# Patient Record
Sex: Male | Born: 1980 | Race: Black or African American | Hispanic: No | Marital: Single | Smoking: Current every day smoker
Health system: Southern US, Community
[De-identification: ages and names within clinical notes are randomized; demographics above are authoritative.]

## PROBLEM LIST (undated history)

## (undated) DIAGNOSIS — I079 Rheumatic tricuspid valve disease, unspecified: Secondary | ICD-10-CM

---

## 2012-05-03 ENCOUNTER — Emergency Department: Payer: Self-pay | Admitting: Emergency Medicine

## 2012-05-03 LAB — COMPREHENSIVE METABOLIC PANEL
Albumin: 3.3 g/dL — ABNORMAL LOW (ref 3.4–5.0)
Anion Gap: 4 — ABNORMAL LOW (ref 7–16)
Calcium, Total: 9.6 mg/dL (ref 8.5–10.1)
Chloride: 107 mmol/L (ref 98–107)
Co2: 26 mmol/L (ref 21–32)
EGFR (African American): >60
Glucose: 111 mg/dL — ABNORMAL HIGH (ref 65–99)
Osmolality: 274 (ref 275–301)
Potassium: 4.5 mmol/L (ref 3.5–5.1)
SGOT(AST): 516 U/L — ABNORMAL HIGH (ref 15–37)
SGPT (ALT): 355 U/L — ABNORMAL HIGH (ref 12–78)
Total Protein: 8 g/dL (ref 6.4–8.2)

## 2012-05-03 LAB — CBC WITH DIFFERENTIAL/PLATELET
Basophil #: 0 10*3/uL (ref 0.0–0.1)
Basophil %: 0.2 %
Eosinophil %: 0.6 %
HGB: 14.7 g/dL (ref 13.0–18.0)
Lymphocyte %: 3.2 %
Monocyte #: 0.8 x10 3/mm (ref 0.2–1.0)
Monocyte %: 7.2 %
Neutrophil #: 9.9 10*3/uL — ABNORMAL HIGH (ref 1.4–6.5)
Neutrophil %: 88.8 %
RBC: 5.44 10*6/uL (ref 4.40–5.90)
WBC: 11.2 10*3/uL — ABNORMAL HIGH (ref 3.8–10.6)

## 2012-05-03 LAB — LIPASE, BLOOD: Lipase: 135 U/L (ref 73–393)

## 2017-08-31 DIAGNOSIS — S069XAA Unspecified intracranial injury with loss of consciousness status unknown, initial encounter: Secondary | ICD-10-CM

## 2017-08-31 HISTORY — DX: Unspecified intracranial injury with loss of consciousness status unknown, initial encounter: S06.9XAA

## 2021-02-14 ENCOUNTER — Emergency Department (HOSPITAL_BASED_OUTPATIENT_CLINIC_OR_DEPARTMENT_OTHER): Payer: Self-pay

## 2021-02-14 ENCOUNTER — Encounter (HOSPITAL_BASED_OUTPATIENT_CLINIC_OR_DEPARTMENT_OTHER): Payer: Self-pay

## 2021-02-14 ENCOUNTER — Emergency Department (HOSPITAL_BASED_OUTPATIENT_CLINIC_OR_DEPARTMENT_OTHER): Payer: Self-pay | Admitting: Radiology

## 2021-02-14 ENCOUNTER — Emergency Department (HOSPITAL_BASED_OUTPATIENT_CLINIC_OR_DEPARTMENT_OTHER)
Admission: EM | Admit: 2021-02-14 | Discharge: 2021-02-14 | Disposition: A | Discharge status: Home / Self Care | Payer: Self-pay | Attending: Emergency Medicine | Admitting: Emergency Medicine

## 2021-02-14 ENCOUNTER — Other Ambulatory Visit: Payer: Self-pay

## 2021-02-14 DIAGNOSIS — R4020 Unspecified coma: Secondary | ICD-10-CM

## 2021-02-14 DIAGNOSIS — R202 Paresthesia of skin: Secondary | ICD-10-CM | POA: Insufficient documentation

## 2021-02-14 DIAGNOSIS — R55 Syncope and collapse: Secondary | ICD-10-CM | POA: Insufficient documentation

## 2021-02-14 DIAGNOSIS — R5381 Other malaise: Secondary | ICD-10-CM | POA: Insufficient documentation

## 2021-02-14 DIAGNOSIS — R5383 Other fatigue: Secondary | ICD-10-CM | POA: Insufficient documentation

## 2021-02-14 DIAGNOSIS — F17210 Nicotine dependence, cigarettes, uncomplicated: Secondary | ICD-10-CM | POA: Insufficient documentation

## 2021-02-14 DIAGNOSIS — R0781 Pleurodynia: Secondary | ICD-10-CM | POA: Insufficient documentation

## 2021-02-14 DIAGNOSIS — R110 Nausea: Secondary | ICD-10-CM | POA: Insufficient documentation

## 2021-02-14 LAB — COMPREHENSIVE METABOLIC PANEL
ALT: 19 U/L (ref 0–44)
AST: 21 U/L (ref 15–41)
Albumin: 4.1 g/dL (ref 3.5–5.0)
Alkaline Phosphatase: 52 U/L (ref 38–126)
Anion gap: 7 (ref 5–15)
BUN: 11 mg/dL (ref 6–20)
CO2: 28 mmol/L (ref 22–32)
Calcium: 10.2 mg/dL (ref 8.9–10.3)
Chloride: 106 mmol/L (ref 98–111)
Creatinine, Ser: 1.16 mg/dL (ref 0.61–1.24)
GFR, Estimated: >60 mL/min (ref >60)
Glucose, Bld: 86 mg/dL (ref 70–99)
Potassium: 4.1 mmol/L (ref 3.5–5.1)
Sodium: 141 mmol/L (ref 135–145)
Total Bilirubin: 0.6 mg/dL (ref 0.3–1.2)
Total Protein: 7.9 g/dL (ref 6.5–8.1)

## 2021-02-14 LAB — RAPID URINE DRUG SCREEN, HOSP PERFORMED
Amphetamines: NOT DETECTED
Barbiturates: NOT DETECTED
Benzodiazepines: NOT DETECTED
Cocaine: NOT DETECTED
Opiates: NOT DETECTED
Tetrahydrocannabinol: POSITIVE — AB

## 2021-02-14 LAB — CBC WITH DIFFERENTIAL/PLATELET
Abs Immature Granulocytes: 0.02 10*3/uL (ref 0.00–0.07)
Basophils Absolute: 0 10*3/uL (ref 0.0–0.1)
Basophils Relative: 1 %
Eosinophils Absolute: 0.1 10*3/uL (ref 0.0–0.5)
Eosinophils Relative: 3 %
HCT: 46.8 % (ref 39.0–52.0)
Hemoglobin: 15.6 g/dL (ref 13.0–17.0)
Immature Granulocytes: 0 %
Lymphocytes Relative: 31 %
Lymphs Abs: 1.7 10*3/uL (ref 0.7–4.0)
MCH: 28 pg (ref 26.0–34.0)
MCHC: 33.3 g/dL (ref 30.0–36.0)
MCV: 83.9 fL (ref 80.0–100.0)
Monocytes Absolute: 0.7 10*3/uL (ref 0.1–1.0)
Monocytes Relative: 13 %
Neutro Abs: 2.9 10*3/uL (ref 1.7–7.7)
Neutrophils Relative %: 52 %
Platelets: 183 10*3/uL (ref 150–400)
RBC: 5.58 MIL/uL (ref 4.22–5.81)
RDW: 15.3 % (ref 11.5–15.5)
WBC: 5.5 10*3/uL (ref 4.0–10.5)
nRBC: 0 % (ref 0.0–0.2)

## 2021-02-14 LAB — LIPASE, BLOOD: Lipase: 41 U/L (ref 11–51)

## 2021-02-14 MED ORDER — IOHEXOL 350 MG/ML SOLN
80.0000 mL | Freq: Once | INTRAVENOUS | Status: AC | PRN
  Administered 2021-02-14: 80 mL via INTRAVENOUS

## 2021-02-14 MED ORDER — DIPHENHYDRAMINE HCL 50 MG/ML IJ SOLN
25.0000 mg | Freq: Once | INTRAMUSCULAR | Status: AC
  Administered 2021-02-14: 25 mg via INTRAVENOUS
  Filled 2021-02-14: qty 1

## 2021-02-14 MED ORDER — LORAZEPAM 2 MG/ML IJ SOLN
1.0000 mg | Freq: Once | INTRAMUSCULAR | Status: AC
  Administered 2021-02-14: 1 mg via INTRAVENOUS
  Filled 2021-02-14: qty 1

## 2021-02-14 MED ORDER — PROCHLORPERAZINE EDISYLATE 10 MG/2ML IJ SOLN
10.0000 mg | Freq: Once | INTRAMUSCULAR | Status: AC
  Administered 2021-02-14: 10 mg via INTRAVENOUS
  Filled 2021-02-14: qty 2

## 2021-02-14 MED ORDER — SODIUM CHLORIDE 0.9 % IV BOLUS
1000.0000 mL | Freq: Once | INTRAVENOUS | Status: AC
  Administered 2021-02-14: 1000 mL via INTRAVENOUS

## 2021-02-14 NOTE — Discharge Instructions (Signed)
Recommend that you do not drive or do any dangerous activities until you are cleared by neurology and have seizure ruled out as possible cause of your symptoms.  Suspect that this is likely from complex migraines and recommend evaluation by neurology.
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
## 2021-02-14 NOTE — ED Notes (Signed)
Patient having apparent panic attack, restless, crying, rapid shallow breathing.  MD notified, new order for Ativan given.

## 2022-02-01 IMAGING — CT CT CERVICAL SPINE W/O CM
3 series · 14 of 33 positions shown, 17 images · non-contrast
Comparison: None.

CLINICAL DATA: Syncopal episode resulting in a fall.

EXAM:
CT HEAD WITHOUT CONTRAST
CT CERVICAL SPINE WITHOUT CONTRAST
TECHNIQUE: Multidetector CT imaging of the head and cervical spine was
performed following the standard protocol without intravenous
contrast. Multiplanar CT image reconstructions of the cervical spine
were also generated.

[Series 4: c spine soft · axial · 0.32mm/px · z∈[-400,-264]mm · 6 of 89 slices shown, 8 images]
[im 14/89  soft-tissue]
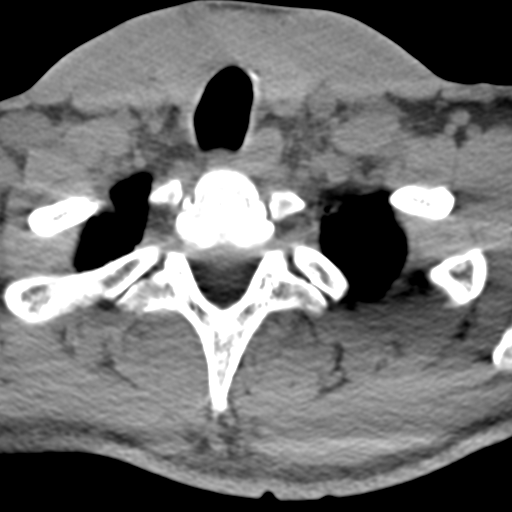
[im 14/89  bone]
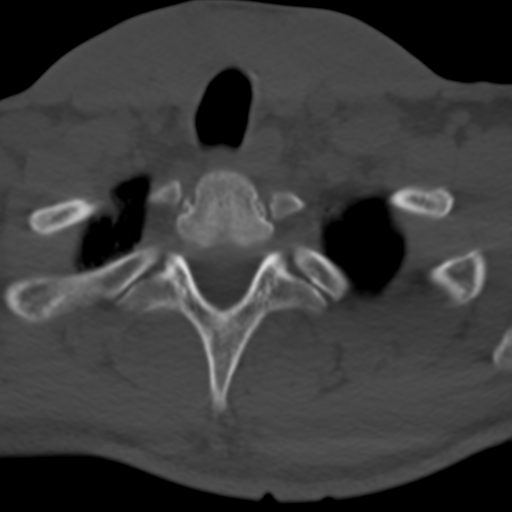
[im 28/89  bone]
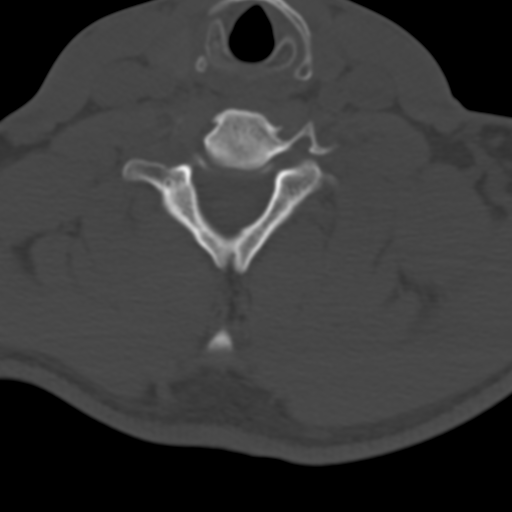
[im 41/89  bone]
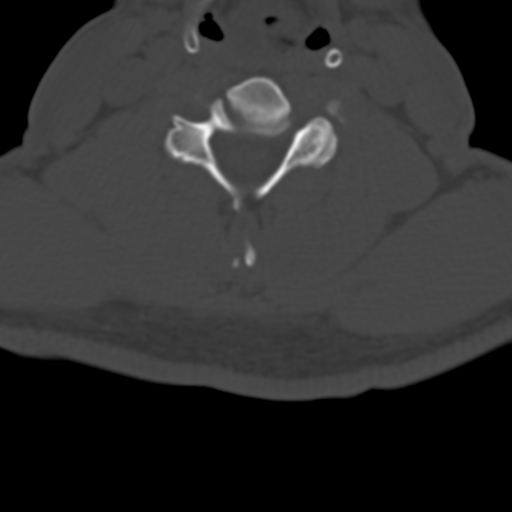
[im 55/89  bone]
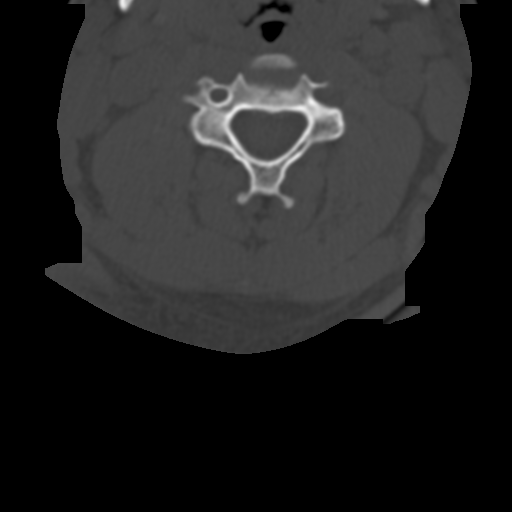
[im 68/89  soft-tissue]
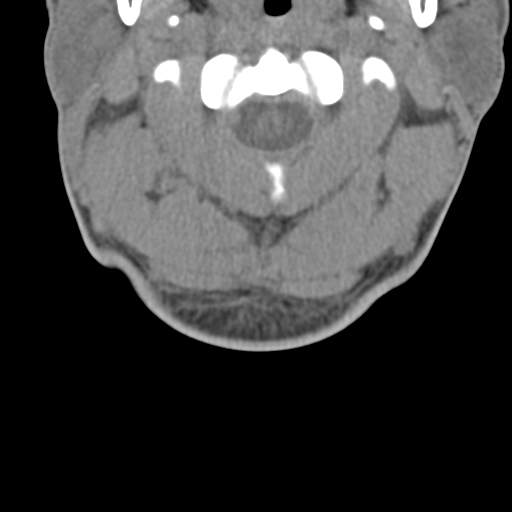
[im 68/89  bone]
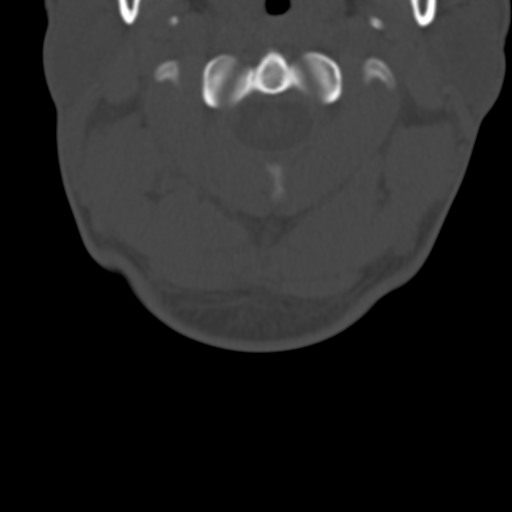
[im 82/89  bone]
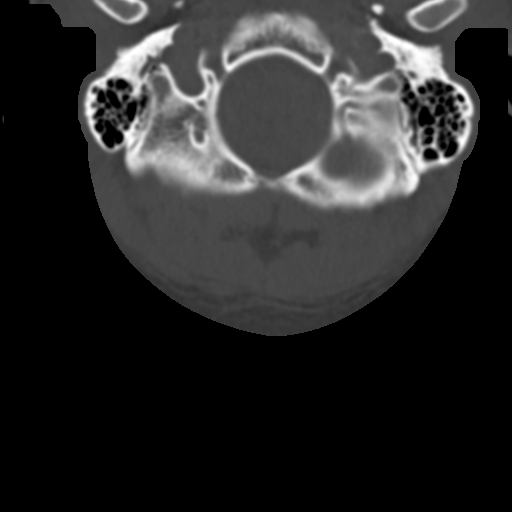

[Series 5: cor bone · coronal · 0.35mm/px · 3 of 52 slices shown]
[im 11/52  bone]
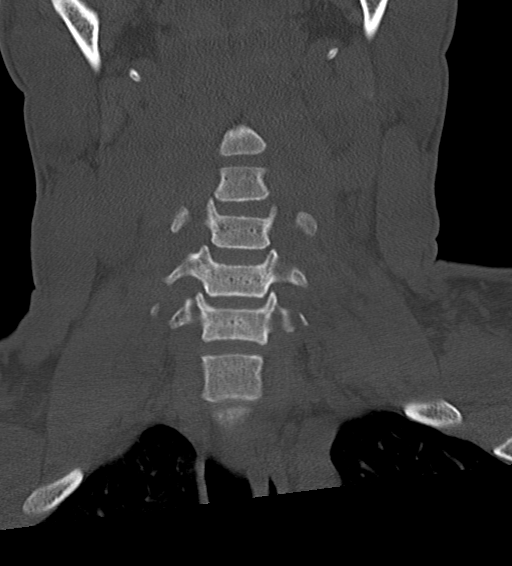
[im 21/52  bone]
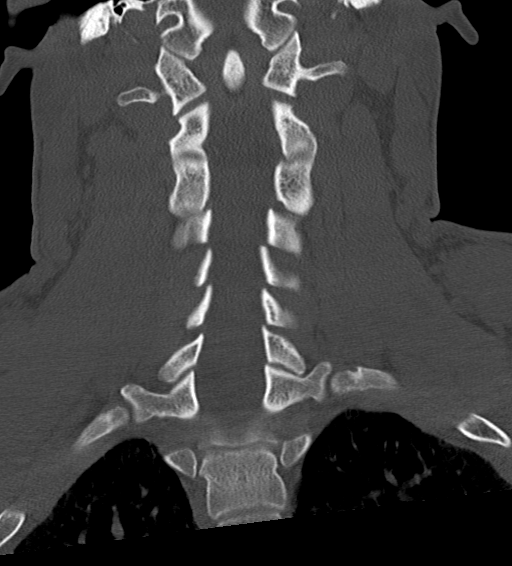
[im 31/52  bone]
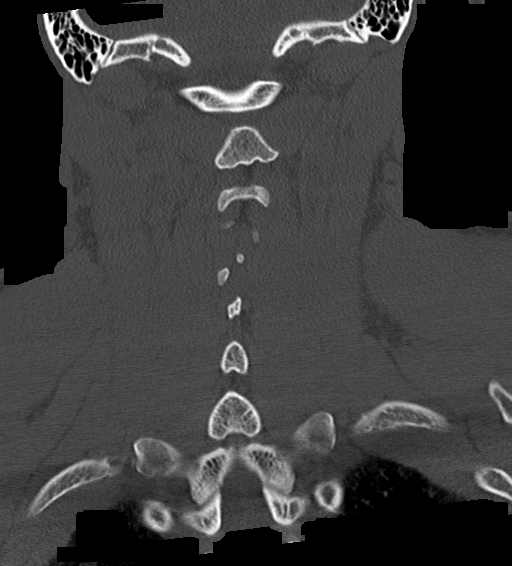

[Series 6: sag bone · sagittal · 0.29mm/px · 5 of 52 slices shown, 6 images]
[im 18/52  bone]
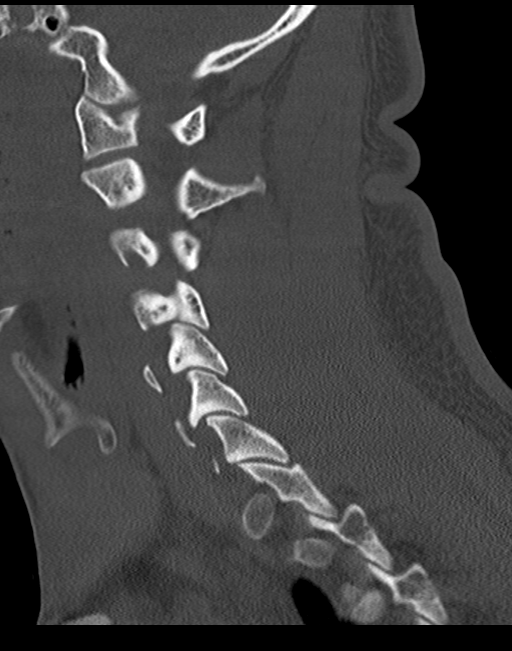
[im 22/52  bone]
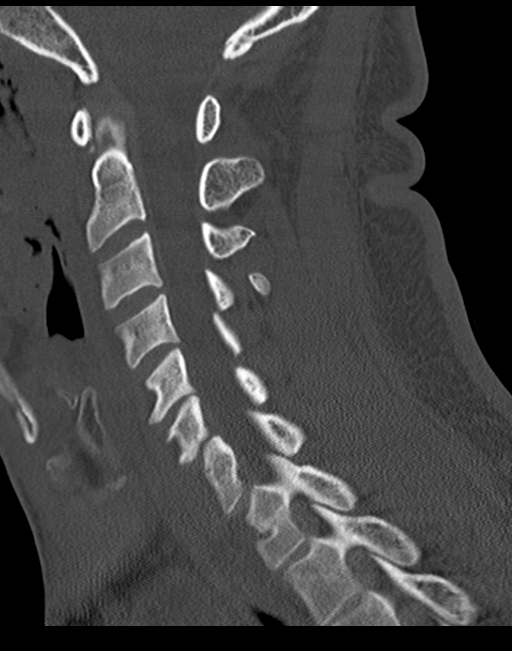
[im 26/52  soft-tissue]
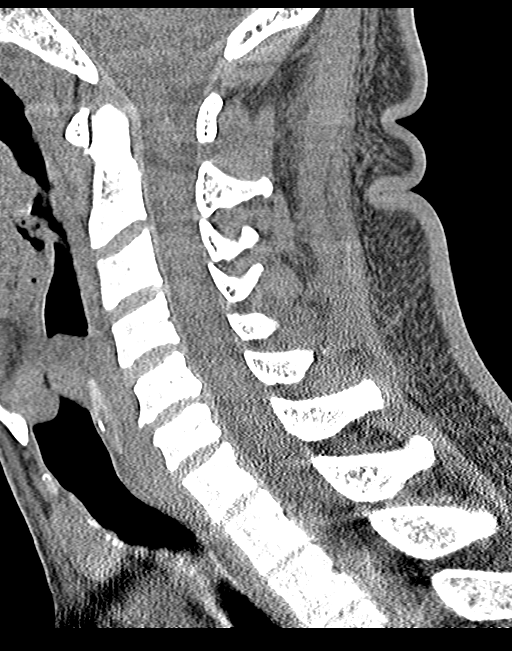
[im 26/52  bone]
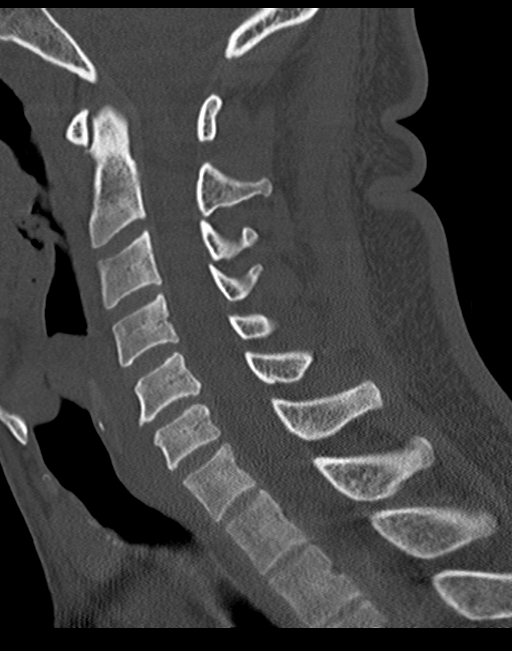
[im 30/52  bone]
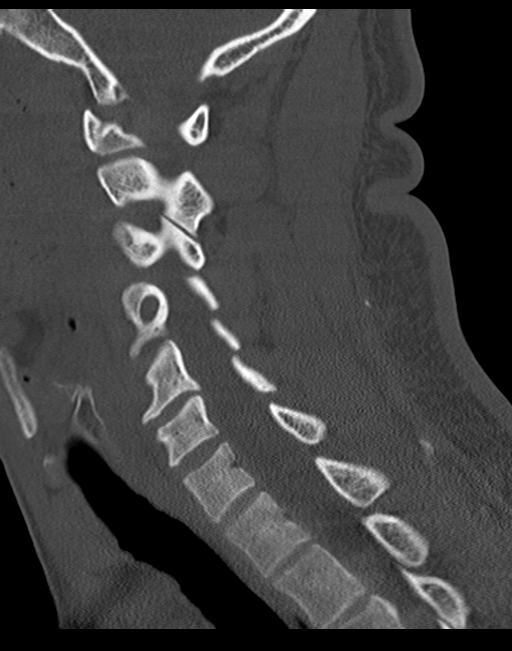
[im 35/52  bone]
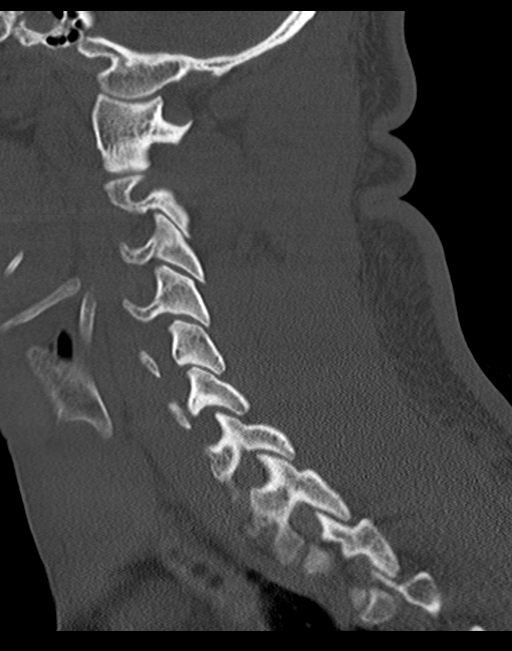

[14 of 33 positions shown; findings below may reference images not displayed]

FINDINGS: CT HEAD FINDINGS

Brain: There is no evidence of an acute infarct, intracranial
hemorrhage, mass, midline shift, or extra-axial fluid collection.
The ventricles and sulci are normal.

Vascular: No hyperdense vessel.

Skull: No fracture or suspicious osseous lesion.

Sinuses/Orbits: Trace left ethmoid air cell mucosal thickening.
Clear mastoid air cells. Unremarkable orbits.

Other: None.

CT CERVICAL SPINE FINDINGS

Alignment: Normal.

Skull base and vertebrae: No acute fracture or suspicious osseous
lesion.

Soft tissues and spinal canal: No prevertebral fluid or swelling. No
visible canal hematoma.

Disc levels: Mild spondylosis at C5-6. No evidence of significant
stenosis.

Upper chest: Clear lung apices.

Other: None.
IMPRESSION: 1. Negative head CT.
2. No evidence of acute fracture or traumatic malalignment in the
cervical spine.

## 2022-08-14 ENCOUNTER — Emergency Department (HOSPITAL_COMMUNITY): Payer: Self-pay

## 2022-08-14 ENCOUNTER — Encounter (HOSPITAL_COMMUNITY): Payer: Self-pay | Admitting: Emergency Medicine

## 2022-08-14 ENCOUNTER — Other Ambulatory Visit: Payer: Self-pay

## 2022-08-14 ENCOUNTER — Emergency Department (HOSPITAL_COMMUNITY)
Admission: EM | Admit: 2022-08-14 | Discharge: 2022-08-14 | Disposition: A | Discharge status: Home / Self Care | Payer: Self-pay | Attending: Emergency Medicine | Admitting: Emergency Medicine

## 2022-08-14 DIAGNOSIS — I10 Essential (primary) hypertension: Secondary | ICD-10-CM | POA: Insufficient documentation

## 2022-08-14 DIAGNOSIS — Z87891 Personal history of nicotine dependence: Secondary | ICD-10-CM | POA: Insufficient documentation

## 2022-08-14 DIAGNOSIS — R059 Cough, unspecified: Secondary | ICD-10-CM | POA: Insufficient documentation

## 2022-08-14 DIAGNOSIS — R079 Chest pain, unspecified: Secondary | ICD-10-CM

## 2022-08-14 LAB — BASIC METABOLIC PANEL
Anion gap: 5 (ref 5–15)
BUN: 13 mg/dL (ref 6–20)
CO2: 29 mmol/L (ref 22–32)
Calcium: 9.5 mg/dL (ref 8.9–10.3)
Chloride: 108 mmol/L (ref 98–111)
Creatinine, Ser: 0.86 mg/dL (ref 0.61–1.24)
GFR, Estimated: >60 mL/min (ref >60)
Glucose, Bld: 95 mg/dL (ref 70–99)
Potassium: 3.8 mmol/L (ref 3.5–5.1)
Sodium: 142 mmol/L (ref 135–145)

## 2022-08-14 LAB — CBC
HCT: 44.9 % (ref 39.0–52.0)
Hemoglobin: 14.5 g/dL (ref 13.0–17.0)
MCH: 28 pg (ref 26.0–34.0)
MCHC: 32.3 g/dL (ref 30.0–36.0)
MCV: 86.7 fL (ref 80.0–100.0)
Platelets: 231 10*3/uL (ref 150–400)
RBC: 5.18 MIL/uL (ref 4.22–5.81)
RDW: 14.5 % (ref 11.5–15.5)
WBC: 5.4 10*3/uL (ref 4.0–10.5)
nRBC: 0 % (ref 0.0–0.2)

## 2022-08-14 LAB — TROPONIN I (HIGH SENSITIVITY)
Troponin I (High Sensitivity): 3 ng/L (ref <18)
Troponin I (High Sensitivity): 3 ng/L (ref <18)

## 2022-08-14 NOTE — ED Provider Triage Note (Signed)
Emergency Medicine Provider Triage Evaluation Note  Brad Walton , a 41 y.o. male  was evaluated in triage.  Pt complains of chest pain. States that same has been ongoing for the past few days since he switched from smoking cigarettes to a new vape. 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 Pain is substernal and does not radiate. Denies fevers or chills. No cardiac history  Review of Systems  Positive:  Negative:   Physical Exam  BP (!) 148/69 (BP Location: Left Arm)   Pulse 60   Temp 98.4 F (36.9 C) (Oral)   Resp 18   SpO2 100%  Gen:   Awake, no distress   Resp:  Normal effort  MSK:   Moves extremities without difficulty Other:    Medical Decision Making  Medically screening exam initiated at 10:50 AM.  Appropriate orders placed.  Zaylin Runco was informed that the remainder of the evaluation will be completed by another provider, this initial triage assessment does not replace that evaluation, and the importance of remaining in the ED until their evaluation is complete.     Silva Bandy, PA-C 08/14/22 1052

## 2022-08-14 NOTE — ED Notes (Signed)
Patient did not responded when I call patient for vital sign recheck

## 2022-08-14 NOTE — Discharge Instructions (Signed)
You are seen in the emergency department for evaluation of some chest pain cough shortness of breath.  Your chest x-ray did not show any evidence of pneumonia and your blood test and EKG did not show any evidence of heart injury.  This may be related to vaping and you should consider stopping that.  Your blood pressure was also elevated here and this will need to be followed up with the primary care doctor.  Return to emergency department if any worsening or concerning symptoms.

## 2022-08-14 NOTE — ED Provider Notes (Signed)
[facility]-EMERGENCY DEPT Provider Note   CSN: [id number] Arrival date & time: 08/14/22  0950     History  Chief Complaint  Patient presents with   Chest Pain    Brad Walton is a 41 y.o. male.  He has no significant past medical history.  He is complaining of some cough and chest tightness.  Has some pain in his "diaphragm" with deep breath.  No fevers or chills nausea vomiting.  Not associated with any dizziness diaphoresis.  He said he stopped smoking cigarettes in October and began vaping and that is when his symptoms started.  The history is provided by the patient.  Chest Pain Pain location:  L chest and R chest Pain quality: pressure   Pain severity:  Moderate Onset quality:  Gradual Timing:  Intermittent Progression:  Unchanged Chronicity:  New Context: breathing   Relieved by:  None tried Worsened by:  Deep breathing Ineffective treatments:  None tried Associated symptoms: cough and shortness of breath   Associated symptoms: no abdominal pain, no diaphoresis, no fever, no nausea, no palpitations and no vomiting   Risk factors: male sex and smoking        Home Medications Prior to Admission medications   Not on File      Allergies    Sulfa antibiotics    Review of Systems   Review of Systems  Constitutional:  Negative for diaphoresis and fever.  Eyes:  Negative for visual disturbance.  Respiratory:  Positive for cough and shortness of breath.   Cardiovascular:  Positive for chest pain. Negative for palpitations.  Gastrointestinal:  Negative for abdominal pain, nausea and vomiting.    Physical Exam Updated Vital Signs BP (!) 148/69 (BP Location: Left Arm)   Pulse 60   Temp 98.4 F (36.9 C) (Oral)   Resp 18   Ht 5\' 8"  (1.727 m)   Wt 86.2 kg   SpO2 100%   BMI 28.89 kg/m  Physical Exam Vitals and nursing note reviewed.  Constitutional:      General: He is not in acute distress.    Appearance: He is well-developed.   HENT:     Head: Normocephalic and atraumatic.  Eyes:     Conjunctiva/sclera: Conjunctivae normal.  Cardiovascular:     Rate and Rhythm: Normal rate and regular rhythm.     Heart sounds: Normal heart sounds. No murmur heard. Pulmonary:     Effort: Pulmonary effort is normal. No respiratory distress.     Breath sounds: Normal breath sounds.  Abdominal:     Palpations: Abdomen is soft.     Tenderness: There is no abdominal tenderness. There is no guarding or rebound.  Musculoskeletal:        General: No swelling. Normal range of motion.     Cervical back: Neck supple.     Right lower leg: No tenderness. No edema.     Left lower leg: No tenderness. No edema.  Skin:    General: Skin is warm and dry.     Capillary Refill: Capillary refill takes less than 2 seconds.  Neurological:     General: No focal deficit present.     Mental Status: He is alert.     ED Results / Procedures / Treatments   Labs (all labs ordered are listed, but only abnormal results are displayed) Labs Reviewed  BASIC METABOLIC PANEL  CBC  TROPONIN I (HIGH SENSITIVITY)  TROPONIN I (HIGH SENSITIVITY)    EKG EKG Interpretation  Date/Time:  Friday August 14 2022 10:07:33 EST Ventricular Rate:  53 PR Interval:  132 QRS Duration: 101 QT Interval:  418 QTC Calculation: 393 R Axis:   18 Text Interpretation: Sinus rhythm Baseline wander in lead(s) V4 No significant change since prior 6/22 Confirmed by Meridee Score [phone]) on 08/14/2022 10:09:31 AM  Radiology DG Chest 2 View  Result Date: 08/14/2022 CLINICAL DATA:  Chest pain. EXAM: CHEST - 2 VIEW COMPARISON:  Chest two views 02/14/2021 FINDINGS: Cardiac silhouette and mediastinal contours are within normal limits. The lungs are clear. No pleural effusion or pneumothorax. Minimal multilevel degenerative disc changes of the thoracic spine. IMPRESSION: No active cardiopulmonary disease. Electronically Signed   By: Neita Garnet M.D.   On: 08/14/2022 11:22     Procedures Procedures    Medications Ordered in ED Medications - No data to display  ED Course/ Medical Decision Making/ A&P                           Medical Decision Making Amount and/or Complexity of Data Reviewed Labs: ordered. Radiology: ordered.   This patient complains of chest tightness cough; this involves an extensive number of treatment Options and is a complaint that carries with it a high risk of complications and morbidity. The differential includes COVID, flu, pneumonia, ACS, pneumothorax, PE  I ordered, reviewed and interpreted labs, which included CBC chemistry troponins flat I ordered imaging studies which included chest x-ray and I independently    visualized and interpreted imaging which showed no acute findings Previous records obtained and reviewed in epic no recent admissions Cardiac monitoring reviewed, normal sinus rhythm Social determinants considered, no significant barriers Critical Interventions: None  After the interventions stated above, I reevaluated the patient and found patient to be satting well on room air no distress.  PERC negative. Admission and further testing considered, no indications for admission or further workup at this time.  Recommended discontinuing vaping and follow-up PCP return instructions discussed         Final Clinical Impression(s) / ED Diagnoses Final diagnoses:  Nonspecific chest pain  Primary hypertension    Rx / DC Orders ED Discharge Orders     None         Terrilee Files, MD 08/14/22 1806

## 2022-08-14 NOTE — ED Triage Notes (Signed)
Pt reports cough and chest tightness. Pt reports pain is worse when deep breathing. Pt reports he has recently quit smoking and started vaping. Denies fevers.

## 2023-01-02 ENCOUNTER — Emergency Department (HOSPITAL_BASED_OUTPATIENT_CLINIC_OR_DEPARTMENT_OTHER): Payer: Self-pay | Admitting: Radiology

## 2023-01-02 ENCOUNTER — Emergency Department (HOSPITAL_BASED_OUTPATIENT_CLINIC_OR_DEPARTMENT_OTHER)
Admission: EM | Admit: 2023-01-02 | Discharge: 2023-01-02 | Disposition: A | Discharge status: Home / Self Care | Payer: Self-pay | Attending: Emergency Medicine | Admitting: Emergency Medicine

## 2023-01-02 ENCOUNTER — Other Ambulatory Visit: Payer: Self-pay

## 2023-01-02 ENCOUNTER — Encounter (HOSPITAL_BASED_OUTPATIENT_CLINIC_OR_DEPARTMENT_OTHER): Payer: Self-pay | Admitting: Emergency Medicine

## 2023-01-02 DIAGNOSIS — Y93B9 Activity, other involving muscle strengthening exercises: Secondary | ICD-10-CM | POA: Insufficient documentation

## 2023-01-02 DIAGNOSIS — X509XXA Other and unspecified overexertion or strenuous movements or postures, initial encounter: Secondary | ICD-10-CM | POA: Insufficient documentation

## 2023-01-02 DIAGNOSIS — M25512 Pain in left shoulder: Secondary | ICD-10-CM

## 2023-01-02 MED ORDER — IBUPROFEN 400 MG PO TABS: 800.0000 mg | ORAL_TABLET | Freq: Three times a day (TID) | ORAL | 0 refills | Status: DC | PRN

## 2023-01-02 NOTE — ED Triage Notes (Addendum)
Pt presents to ED POV. Pt c/o L shoulder pain x3d. Pt reports that pain began in the gym but worsened while lifting something at work and he heard a pop. Pt reports he is unable to lift arm above head. Pt believe is dislocated. L radial +2

## 2023-01-02 NOTE — ED Provider Notes (Signed)
[place] EMERGENCY DEPARTMENT AT [facility] Provider Note   CSN: [id number] Arrival date & time: 01/02/23  9528     History No chief complaint on file.   HPI Brad Walton is a 42 y.o. male presenting for chief complaint of left shoulder pain.  States that 4 days ago he was at the gym doing a chest workout when he felt a popping sensation in his left shoulder.  He has been trying to be careful with that over the last 4 days but unfortunately is having worsening pain.  Denies fevers chills nausea vomiting syncope or shortness of breath.  Otherwise ambulatory tolerating p.o. intake.  No known sick contacts. History of location in this arm.  He is concerned for dislocation relocation event. Neurovascularly intact.  Patient's recorded medical, surgical, social, medication list and allergies were reviewed in the Snapshot window as part of the initial history.   Review of Systems   Review of Systems  Constitutional:  Negative for chills and fever.  HENT:  Negative for ear pain and sore throat.   Eyes:  Negative for pain and visual disturbance.  Respiratory:  Negative for cough and shortness of breath.   Cardiovascular:  Negative for chest pain and palpitations.  Gastrointestinal:  Negative for abdominal pain and vomiting.  Genitourinary:  Negative for dysuria and hematuria.  Musculoskeletal:  Negative for arthralgias and back pain.  Skin:  Negative for color change and rash.  Neurological:  Negative for seizures and syncope.  All other systems reviewed and are negative.   Physical Exam Updated Vital Signs BP (!) 122/92 (BP Location: Left Arm)   Pulse (!) 51   Temp 98.4 F (36.9 C) (Oral)   Resp 18   SpO2 100%  Physical Exam Vitals and nursing note reviewed.  Constitutional:      General: He is not in acute distress.    Appearance: He is well-developed.  HENT:     Head: Normocephalic and atraumatic.  Eyes:     Conjunctiva/sclera: Conjunctivae normal.   Cardiovascular:     Rate and Rhythm: Normal rate and regular rhythm.     Heart sounds: No murmur heard. Pulmonary:     Effort: Pulmonary effort is normal. No respiratory distress.     Breath sounds: Normal breath sounds.  Abdominal:     Palpations: Abdomen is soft.     Tenderness: There is no abdominal tenderness.  Musculoskeletal:        General: No swelling.     Cervical back: Neck supple.     Comments: Able to cross the midline and touch his contralateral shoulder comfortably with both arms. Increased prominence of the left acromion compared to the right.  Skin:    General: Skin is warm and dry.     Capillary Refill: Capillary refill takes less than 2 seconds.  Neurological:     Mental Status: He is alert.  Psychiatric:        Mood and Affect: Mood normal.      ED Course/ Medical Decision Making/ A&P    Procedures Procedures   Medications Ordered in ED Medications - No data to display  Medical Decision Making:   Patient is present with musculoskeletal injury to the left shoulder.  Dislocation fracture all considered possible based on the mechanism.  X-ray performed demonstrating no focal pathology. I recommended close follow-up with primary care/orthopedic care in the outpatient setting.  Will place patient into a sling and recommend anti-inflammatories x 5 days.   Disposition:  I have considered need for hospitalization, however, considering all of the above, I believe this patient is stable for discharge at this time.  Patient/family educated about specific return precautions for given chief complaint and symptoms.  Patient/family educated about follow-up with PCP .     Patient/family expressed understanding of return precautions and need for follow-up. Patient spoken to regarding all imaging and laboratory results and appropriate follow up for these results. All education provided in verbal form with additional information in written form. Time was allowed for  answering of patient questions. Patient discharged.    Emergency Department Medication Summary:  Medications - No data to display     Clinical Impression:  1. Acute pain of left shoulder      Discharge   Final Clinical Impression(s) / ED Diagnoses Final diagnoses:  Acute pain of left shoulder    Rx / DC Orders ED Discharge Orders     None         Glyn Ade, MD 01/02/23 1736

## 2023-01-02 NOTE — ED Notes (Signed)
At this time he is in x-ray.

## 2023-08-15 ENCOUNTER — Encounter (HOSPITAL_COMMUNITY): Payer: Self-pay

## 2023-08-15 ENCOUNTER — Other Ambulatory Visit: Payer: Self-pay

## 2023-08-15 ENCOUNTER — Emergency Department (HOSPITAL_COMMUNITY): Payer: Self-pay

## 2023-08-15 ENCOUNTER — Emergency Department (HOSPITAL_COMMUNITY)
Admission: EM | Admit: 2023-08-15 | Discharge: 2023-08-15 | Disposition: A | Discharge status: Home / Self Care | Payer: Self-pay | Attending: Emergency Medicine | Admitting: Emergency Medicine

## 2023-08-15 DIAGNOSIS — R079 Chest pain, unspecified: Secondary | ICD-10-CM | POA: Insufficient documentation

## 2023-08-15 HISTORY — DX: Rheumatic tricuspid valve disease, unspecified: I07.9

## 2023-08-15 LAB — BASIC METABOLIC PANEL
Anion gap: 7 (ref 5–15)
BUN: 13 mg/dL (ref 6–20)
CO2: 25 mmol/L (ref 22–32)
Calcium: 9.5 mg/dL (ref 8.9–10.3)
Chloride: 101 mmol/L (ref 98–111)
Creatinine, Ser: 1.06 mg/dL (ref 0.61–1.24)
GFR, Estimated: >60 mL/min (ref >60)
Glucose, Bld: 99 mg/dL (ref 70–99)
Potassium: 3.6 mmol/L (ref 3.5–5.1)
Sodium: 133 mmol/L — ABNORMAL LOW (ref 135–145)

## 2023-08-15 LAB — CBC
HCT: 47.6 % (ref 39.0–52.0)
Hemoglobin: 15.4 g/dL (ref 13.0–17.0)
MCH: 27.9 pg (ref 26.0–34.0)
MCHC: 32.4 g/dL (ref 30.0–36.0)
MCV: 86.4 fL (ref 80.0–100.0)
Platelets: 230 10*3/uL (ref 150–400)
RBC: 5.51 MIL/uL (ref 4.22–5.81)
RDW: 14.6 % (ref 11.5–15.5)
WBC: 4.8 10*3/uL (ref 4.0–10.5)
nRBC: 0 % (ref 0.0–0.2)

## 2023-08-15 LAB — TROPONIN I (HIGH SENSITIVITY)
Troponin I (High Sensitivity): 3 ng/L (ref <18)
Troponin I (High Sensitivity): 3 ng/L (ref <18)

## 2023-08-15 NOTE — ED Triage Notes (Signed)
Pt reports with left chest tightness x 3 days. Pt states that it wraps around to his left shoulder.

## 2023-08-15 NOTE — ED Provider Notes (Signed)
[place] EMERGENCY DEPARTMENT AT [facility] Provider Note   CSN: [id number] Arrival date & time: 08/15/23  8413     History  Chief Complaint  Patient presents with   Chest Pain    Brad Walton is a 42 y.o. male patient with past medical history of TBI presenting to emergency room with 3 days of chest pain.  Patient reports that his chest pain started while he was at rest.  Symptoms have been intermittent.  Aggravated by taking deep breaths and or with movement. Reports increased stress events, wondering if that is what is causing this. Denies any association with food or with exertion.  No recent traumas injuries or falls.  Patient has never had cardiac event before.  Denies similar symptoms like this in the past.  Denies any abdominal pain nausea vomiting. No calf swelling or tenderness.    Chest Pain      Home Medications Prior to Admission medications   Medication Sig Start Date End Date Taking? Authorizing Provider  ibuprofen (ADVIL) 400 MG tablet Take 2 tablets (800 mg total) by mouth every 8 (eight) hours as needed. 01/02/23   Glyn Ade, MD      Allergies    Sulfa antibiotics    Review of Systems   Review of Systems  Cardiovascular:  Positive for chest pain.    Physical Exam Updated Vital Signs BP (!) 136/92 (BP Location: Right Arm)   Pulse (!) 59   Temp 98 F (36.7 C)   Resp 15   Ht 5\' 8"  (1.727 m)   Wt 92.1 kg   SpO2 100%   BMI 30.87 kg/m  Physical Exam Vitals and nursing note reviewed.  Constitutional:      General: He is not in acute distress.    Appearance: He is not toxic-appearing.  HENT:     Head: Normocephalic and atraumatic.  Eyes:     General: No scleral icterus.    Conjunctiva/sclera: Conjunctivae normal.  Cardiovascular:     Rate and Rhythm: Normal rate and regular rhythm.     Pulses: Normal pulses.     Heart sounds: Normal heart sounds.  Pulmonary:     Effort: Pulmonary effort is normal. No respiratory  distress.     Breath sounds: Normal breath sounds. No stridor. No wheezing or rales.  Chest:     Chest wall: No tenderness.  Abdominal:     General: Abdomen is flat. Bowel sounds are normal.     Palpations: Abdomen is soft.     Tenderness: There is no abdominal tenderness.  Musculoskeletal:     Right lower leg: No edema.     Left lower leg: No edema.  Skin:    General: Skin is warm and dry.     Capillary Refill: Capillary refill takes less than 2 seconds.     Findings: No lesion.  Neurological:     General: No focal deficit present.     Mental Status: He is alert and oriented to person, place, and time. Mental status is at baseline.     ED Results / Procedures / Treatments   Labs (all labs ordered are listed, but only abnormal results are displayed) Labs Reviewed  BASIC METABOLIC PANEL - Abnormal; Notable for the following components:      Result Value   Sodium 133 (*)    All other components within normal limits  CBC  TROPONIN I (HIGH SENSITIVITY)    EKG None  Radiology DG Chest 2 View Result  Date: 08/15/2023 CLINICAL DATA:  42 year old male with chest pressure, left chest tightness for 3 days radiating to the shoulder. EXAM: CHEST - 2 VIEW COMPARISON:  Chest radiographs 08/14/2022 and earlier. FINDINGS: PA and lateral views 711 hours. Lung volumes and mediastinal contours remain normal. Visualized tracheal air column is within normal limits. Lungs appear stable and clear. No pneumothorax or pleural effusion. No acute osseous abnormality identified. Negative visible bowel gas. IMPRESSION: Negative.  No cardiopulmonary abnormality. Electronically Signed   By: Odessa Fleming M.D.   On: 08/15/2023 07:13    Procedures Procedures    Medications Ordered in ED Medications - No data to display  ED Course/ Medical Decision Making/ A&P                                 Medical Decision Making Amount and/or Complexity of Data Reviewed Labs: ordered. Radiology:  ordered.   Kendarrius Waldeck 42 y.o. presented today for chest pain. Working DDx that I considered at this time includes, but not limited to, ACS, GERD, pe, pna, aortic dissection, pneumothorax, MSK path, anemia, esophageal rupture, CHF exacerbation, valvular disorder, myocarditis, pericarditis, endocarditis, pericardial effusion/cardiac tamponade, pulmonary edema, gastritis/PUD, esophagitis.  R/o Dx: Aortic Dissection: less likely based on the history of present illness - location, quality, onset, and severity of symptoms in this case.   Review of prior external notes: 01/02/23  Unique Tests and My Interpretation:  EKG: Rate, rhythm, axis, intervals all examined: sinus  Troponin: 3 CXR: No acute pathology CBC: No leukocytosis or anemia BMP: Unremarkable  Problem List / ED Course / Critical interventions / Medication management  Patient reporting to emergency room with chest pain.  Patient is no shortness of breath and no wheezing on exam. No sign of fluid overload on exam nor CXR.  Patient hemodynamically stable and well-appearing.  Normal EKG and 2 normal troponins.  Patient has family history of cardiac disease requesting to follow-up outpatient with cardiologist.  Patient's symptoms are atypical of angina.  No acute abnormality on chest x-ray. I feel this is likely musculoskeletal in nature given symptoms.  Patient feels comfortable going home requesting discharge.  Patient has family history of heart disease, would like to see cardiology outpatient.  Patient does not need anything for symptoms or pain.  Patients vitals assessed. Upon arrival patient is hemodynamically stable.  I have reviewed the patients home medicines and have made adjustments as needed     Plan:  F/u w/ PCP to ensure resolution of sx.  Patient was given return precautions. Patient stable for discharge at this time.  Patient educated on sx/ dx and verbalized understanding of plan.  Will return to ER w/ new or  worsening sx.          Final Clinical Impression(s) / ED Diagnoses Final diagnoses:  Chest pain, unspecified type    Rx / DC Orders ED Discharge Orders     None         Smitty Knudsen, PA-C 08/15/23 1044    Jacalyn Lefevre, MD 08/15/23 1105

## 2023-08-15 NOTE — Discharge Instructions (Addendum)
You are seen in emergency room today for chest pain. You can alternate Tylenol and Ibuprofen for pain, you can also try ice or heat over the area. I have placed referral to cardiology with family history. Please return to emergency room with new or worsening symptoms.

## 2023-11-06 ENCOUNTER — Emergency Department (HOSPITAL_COMMUNITY)
Admission: EM | Admit: 2023-11-06 | Discharge: 2023-11-06 | Disposition: A | Discharge status: Home / Self Care | Payer: Self-pay | Attending: Emergency Medicine | Admitting: Emergency Medicine

## 2023-11-06 ENCOUNTER — Encounter (HOSPITAL_COMMUNITY): Payer: Self-pay

## 2023-11-06 ENCOUNTER — Other Ambulatory Visit: Payer: Self-pay

## 2023-11-06 ENCOUNTER — Other Ambulatory Visit (HOSPITAL_COMMUNITY): Payer: Self-pay

## 2023-11-06 DIAGNOSIS — U071 COVID-19: Secondary | ICD-10-CM | POA: Insufficient documentation

## 2023-11-06 LAB — RESP PANEL BY RT-PCR (RSV, FLU A&B, COVID)  RVPGX2
Influenza A by PCR: NEGATIVE
Influenza B by PCR: NEGATIVE
Resp Syncytial Virus by PCR: NEGATIVE
SARS Coronavirus 2 by RT PCR: POSITIVE — AB

## 2023-11-06 MED ORDER — BENZONATATE 100 MG PO CAPS
100.0000 mg | ORAL_CAPSULE | Freq: Three times a day (TID) | ORAL | 0 refills | Status: DC
  Filled 2023-11-06: qty 21, 7d supply, fill #0

## 2023-11-06 NOTE — ED Provider Notes (Signed)
 [place] EMERGENCY DEPARTMENT AT [facility] Provider Note   CSN: [id number] Arrival date & time: 11/06/23  0920     History  Chief Complaint  Patient presents with   Nasal Congestion   Cough    Brad Walton is a 43 y.o. male.  Patient noncontributory past medical history presents today with complaints of cough congestion.  He states that same began 2 days ago and has been persistent since then.  States everyone in his house is sick with similar symptoms.  Denies chest pain or shortness of breath. No fevers or chills.   The history is provided by the patient. No language interpreter was used.  Cough      Home Medications Prior to Admission medications   Medication Sig Start Date End Date Taking? Authorizing Provider  ibuprofen (ADVIL) 400 MG tablet Take 2 tablets (800 mg total) by mouth every 8 (eight) hours as needed. 01/02/23   Glyn Ade, MD      Allergies    Sulfa antibiotics    Review of Systems   Review of Systems  HENT:  Positive for congestion.   Respiratory:  Positive for cough.   All other systems reviewed and are negative.   Physical Exam Updated Vital Signs BP 131/76 (BP Location: Left Arm)   Pulse 81   Temp 98.5 F (36.9 C) (Oral)   Resp 16   Ht 5\' 8"  (1.727 m)   Wt 90.7 kg   SpO2 98%   BMI 30.41 kg/m  Physical Exam Vitals and nursing note reviewed.  Constitutional:      General: He is not in acute distress.    Appearance: Normal appearance. He is normal weight. He is not ill-appearing, toxic-appearing or diaphoretic.  HENT:     Head: Normocephalic and atraumatic.  Cardiovascular:     Rate and Rhythm: Normal rate and regular rhythm.     Heart sounds: Normal heart sounds.  Pulmonary:     Effort: Pulmonary effort is normal. No respiratory distress.     Breath sounds: Normal breath sounds.  Abdominal:     General: Abdomen is flat.     Tenderness: There is no abdominal tenderness.  Musculoskeletal:        General:  Normal range of motion.     Cervical back: Normal range of motion.  Skin:    General: Skin is warm and dry.  Neurological:     General: No focal deficit present.     Mental Status: He is alert.  Psychiatric:        Mood and Affect: Mood normal.        Behavior: Behavior normal.     ED Results / Procedures / Treatments   Labs (all labs ordered are listed, but only abnormal results are displayed) Labs Reviewed  RESP PANEL BY RT-PCR (RSV, FLU A&B, COVID)  RVPGX2 - Abnormal; Notable for the following components:      Result Value   SARS Coronavirus 2 by RT PCR POSITIVE (*)    All other components within normal limits    EKG None  Radiology No results found.  Procedures Procedures    Medications Ordered in ED Medications - No data to display  ED Course/ Medical Decision Making/ A&P                                 Medical Decision Making  Patient presents today with complaints of cough  and congestion x 2 days.  They are afebrile, nontoxic-appearing, and in no acute distress with reassuring vital signs.  Physical exam reveals lung sounds clear to auscultation in all fields. No indication for CXR imaging at this time. Patient positive for COVID, symptoms consistent with same. Discussed with patient that there is no indication for antibiotics for viral infections. No comorbid conditions to warrant Paxlovid use. Will send for tessalon for cough and recommend close outpatient follow-up with return precautions. Evaluation and diagnostic testing in the emergency department does not suggest an emergent condition requiring admission or immediate intervention beyond what has been performed at this time.  Plan for discharge with close PCP follow-up.  Patient is understanding and amenable with plan, educated on red flag symptoms that would prompt immediate return.  Patient discharged in stable condition.  Final Clinical Impression(s) / ED Diagnoses Final diagnoses:  COVID    Rx / DC  Orders ED Discharge Orders          Ordered    benzonatate (TESSALON) 100 MG capsule  Every 8 hours        11/06/23 1123          An After Visit Summary was printed and given to the patient.     Vear Clock 11/06/23 1124    Wynetta Fines, MD 11/06/23 1447

## 2023-11-06 NOTE — ED Triage Notes (Signed)
 Pt reports with congestion, cough, and headache x 2 days.

## 2023-11-06 NOTE — Discharge Instructions (Signed)
 As we discussed, you tested positive for COVID today which is likely the cause of your symptoms.  As this is a viral illness, no antibiotics are indicated.  I recommend that you get plenty of rest and focus on symptomatic relief which includes Cepacol throat lozenges for sore throat, Mucinex D (orange box) which you can get from behind the counter at your local pharmacy for congestion, and tylenol/ibuprofen as needed for fevers and bodyaches. I have also given you a prescription for tessalon which is a cough suppressant medication for you to take as prescribed as needed for management of your symptoms. I also recommend:  Increased fluid intake. Sports drinks offer valuable electrolytes, sugars, and fluids.  Breathing heated mist or steam (vaporizer or shower).  Eating chicken soup or other clear broths, and maintaining good nutrition.   Increasing usage of your inhaler if you have asthma.  Return to work when your temperature has returned to normal.  Gargle warm salt water and spit it out for sore throat. Take benadryl or Zyrtec to decrease sinus secretions.  Follow Up: Follow up with your primary care doctor in 5-7 days for recheck of ongoing symptoms.  Return to emergency department for emergent changing or worsening of symptoms.

## 2023-11-16 ENCOUNTER — Other Ambulatory Visit (HOSPITAL_COMMUNITY): Payer: Self-pay

## 2024-01-11 ENCOUNTER — Emergency Department (HOSPITAL_COMMUNITY): Payer: Self-pay

## 2024-01-11 ENCOUNTER — Encounter (HOSPITAL_COMMUNITY): Payer: Self-pay

## 2024-01-11 ENCOUNTER — Emergency Department (HOSPITAL_COMMUNITY)
Admission: EM | Admit: 2024-01-11 | Discharge: 2024-01-11 | Discharge status: Against Medical Advice | Payer: Self-pay | Attending: Emergency Medicine | Admitting: Emergency Medicine

## 2024-01-11 ENCOUNTER — Other Ambulatory Visit: Payer: Self-pay

## 2024-01-11 DIAGNOSIS — Z5321 Procedure and treatment not carried out due to patient leaving prior to being seen by health care provider: Secondary | ICD-10-CM | POA: Insufficient documentation

## 2024-01-11 DIAGNOSIS — R079 Chest pain, unspecified: Secondary | ICD-10-CM | POA: Insufficient documentation

## 2024-01-11 DIAGNOSIS — R0602 Shortness of breath: Secondary | ICD-10-CM | POA: Insufficient documentation

## 2024-01-11 LAB — BASIC METABOLIC PANEL WITH GFR
Anion gap: 7 (ref 5–15)
BUN: 12 mg/dL (ref 6–20)
CO2: 26 mmol/L (ref 22–32)
Calcium: 9.9 mg/dL (ref 8.9–10.3)
Chloride: 106 mmol/L (ref 98–111)
Creatinine, Ser: 0.97 mg/dL (ref 0.61–1.24)
GFR, Estimated: >60 mL/min (ref >60)
Glucose, Bld: 118 mg/dL — ABNORMAL HIGH (ref 70–99)
Potassium: 3.4 mmol/L — ABNORMAL LOW (ref 3.5–5.1)
Sodium: 139 mmol/L (ref 135–145)

## 2024-01-11 LAB — CBC
HCT: 46.8 % (ref 39.0–52.0)
Hemoglobin: 15.1 g/dL (ref 13.0–17.0)
MCH: 28.3 pg (ref 26.0–34.0)
MCHC: 32.3 g/dL (ref 30.0–36.0)
MCV: 87.6 fL (ref 80.0–100.0)
Platelets: 203 10*3/uL (ref 150–400)
RBC: 5.34 MIL/uL (ref 4.22–5.81)
RDW: 14.8 % (ref 11.5–15.5)
WBC: 6.6 10*3/uL (ref 4.0–10.5)
nRBC: 0 % (ref 0.0–0.2)

## 2024-01-11 LAB — D-DIMER, QUANTITATIVE: D-Dimer, Quant: <0.27 ug{FEU}/mL (ref 0.00–0.50)

## 2024-01-11 LAB — TROPONIN I (HIGH SENSITIVITY): Troponin I (High Sensitivity): 4 ng/L (ref <18)

## 2024-01-11 NOTE — ED Triage Notes (Addendum)
 Left sided chest pain under armpit that radiates into shoulder for 3 days. Pt describes pain like a muscle spasm. SOB that started this AM

## 2024-01-11 NOTE — ED Provider Triage Note (Signed)
 Emergency Medicine Provider Triage Evaluation Note  Brad Walton , a 43 y.o. male  was evaluated in triage.  Pt complains of chest pain and SOB.  Review of Systems  Positive:  Negative:   Physical Exam  BP (!) 151/86   Pulse (!) 54   Temp 98.1 Walton (36.7 C) (Oral)   Resp 16   Ht 5\' 8"  (1.727 m)   Wt 88.6 kg   SpO2 100%   BMI 29.71 kg/m  Gen:   Awake, no distress   Resp:  Normal effort  MSK:   Moves extremities without difficulty  Other:    Medical Decision Making  Medically screening exam initiated at 2:43 PM.  Appropriate orders placed.  Brad Walton was informed that the remainder of the evaluation will be completed by another provider, this initial triage assessment does not replace that evaluation, and the importance of remaining in the ED until their evaluation is complete.  Left sided chest pain x3 days. Endorses DOE since last night. Denies fever, cough, nausea, vomiting, diarrhea.    Brad Walton, [place] 01/11/24 1444

## 2024-03-23 ENCOUNTER — Emergency Department
Admission: EM | Admit: 2024-03-23 | Discharge: 2024-03-23 | Disposition: A | Discharge status: Home / Self Care | Attending: Emergency Medicine | Admitting: Emergency Medicine

## 2024-03-23 ENCOUNTER — Other Ambulatory Visit: Payer: Self-pay

## 2024-03-23 ENCOUNTER — Encounter: Payer: Self-pay | Admitting: Emergency Medicine

## 2024-03-23 ENCOUNTER — Emergency Department

## 2024-03-23 DIAGNOSIS — M542 Cervicalgia: Secondary | ICD-10-CM | POA: Diagnosis present

## 2024-03-23 DIAGNOSIS — M436 Torticollis: Secondary | ICD-10-CM | POA: Insufficient documentation

## 2024-03-23 MED ORDER — NAPROXEN 500 MG PO TABS: 500.0000 mg | ORAL_TABLET | Freq: Two times a day (BID) | ORAL | 0 refills | Status: AC

## 2024-03-23 MED ORDER — CYCLOBENZAPRINE HCL 5 MG PO TABS: 5.0000 mg | ORAL_TABLET | Freq: Three times a day (TID) | ORAL | 0 refills | Status: AC | PRN

## 2024-03-23 NOTE — ED Provider Notes (Signed)
 [facility] Provider Note    Event Date/Time   First MD Initiated Contact with Patient 03/23/24 [phone]     (approximate)   History   Torticollis   HPI  Brad Walton is a 43 y.o. male with no contributory past medical history and as listed in EMR presents to the emergency department for treatment and evaluation of right side neck pain that has been ongoing for the past 2 weeks.  He states that there was a fox in his yard and his dog was going to try and go after it.  He moved quickly to wrap his arm around the dog to prevent him from running and felt a pop type sensation in the right side of his neck.  He has had pain since.  No relief with rest.  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  Pain increases with attempt to rotate head/neck.  No radiculopathy into the arm or fingers.  Pain does radiate to the superior shoulder, subscapular area, and anterior shoulder.     Physical Exam    Vitals:   03/23/24 0850  BP: (!) 145/91  Pulse: (!) 52  Resp: 18  Temp: 97.7 F (36.5 C)  SpO2: 98%    General: Awake, no distress.  CV:  Good peripheral perfusion.  Resp:  Normal effort.  Abd:  No distention.  Other:  Focal area of tenderness to the right lateral neck.   ED Results / Procedures / Treatments   Labs (all labs ordered are listed, but only abnormal results are displayed)  Labs Reviewed - No data to display   EKG  Not indicated   RADIOLOGY  Image and radiology report reviewed and interpreted by me. Radiology report consistent with the same.  CT cervical spine negative for acute concerns.  PROCEDURES:  Critical Care performed: No  Procedures   MEDICATIONS ORDERED IN ED:  Medications - No data to display   IMPRESSION / MDM / ASSESSMENT AND PLAN / ED COURSE   I have reviewed the triage note and vital signs. Vital signs are stable.   Differential diagnosis  includes, but is not limited to, cervical strain, acute torticollis, cervical vertebral injury  Patient's presentation is most consistent with acute illness / injury with system symptoms.  43 year old male presenting to the emergency department for treatment and evaluation of right side neck pain.  See HPI for further details.  Exam is most consistent with acute torticollis, however due to mechanism of injury plan will be to get a CT image of the cervical spine to ensure there is no vertebral injury.  Patient aware and agreeable to the plan.  CT head and cervical spine is negative for acute concerns.  Patient will be treated with Flexeril  and Naprosyn  and encouraged to do gentle stretching and perform gentle range of motion a couple times a day.  He will be provided with a work excuse as well.  Outpatient follow-up and ER return precautions given.      FINAL CLINICAL IMPRESSION(S) / ED DIAGNOSES   Final diagnoses:  Acute torticollis     Rx / DC Orders   ED Discharge Orders          Ordered    cyclobenzaprine  (FLEXERIL ) 5 MG tablet  3 times daily PRN        03/23/24 1154    naproxen  (NAPROSYN ) 500 MG tablet  2 times daily with  meals        03/23/24 1154             Note:  This document was prepared using Dragon voice recognition software and may include unintentional dictation errors.   Herlinda Kirk NOVAK, FNP 03/23/24 1410    Ernest Ronal BRAVO, MD 03/23/24 [phone]

## 2024-03-23 NOTE — ED Triage Notes (Signed)
 Pt in with co neck pain states worse to right side and painful to move neck. Unsure of injury but does work out often.

## 2024-03-23 NOTE — ED Notes (Signed)
 See triage note  Presents with pain to right side of neck  States pain started about 4-5 days ago Denies any injury  States he goes to the gym but took about 1 week off   Describes pain as jointpain

## 2024-03-23 NOTE — Discharge Instructions (Addendum)
 Follow up with orthopedics if not improving over the next few days.  Return to the ER for symptoms that change or worsen if unable to schedule an appointment.

## 2024-05-25 ENCOUNTER — Emergency Department

## 2024-05-25 ENCOUNTER — Other Ambulatory Visit: Payer: Self-pay

## 2024-05-25 ENCOUNTER — Emergency Department
Admission: EM | Admit: 2024-05-25 | Discharge: 2024-05-25 | Disposition: A | Discharge status: Home / Self Care | Attending: Emergency Medicine | Admitting: Emergency Medicine

## 2024-05-25 DIAGNOSIS — M25562 Pain in left knee: Secondary | ICD-10-CM | POA: Insufficient documentation

## 2024-05-25 DIAGNOSIS — X509XXA Other and unspecified overexertion or strenuous movements or postures, initial encounter: Secondary | ICD-10-CM | POA: Diagnosis not present

## 2024-05-25 DIAGNOSIS — Y9239 Other specified sports and athletic area as the place of occurrence of the external cause: Secondary | ICD-10-CM | POA: Diagnosis not present

## 2024-05-25 NOTE — ED Triage Notes (Signed)
 Pt reports his left knee began hurting yesterday after the gym.

## 2024-05-25 NOTE — ED Notes (Signed)
 See triage note  Presents with left knee pain  Denies nay fall   States pain started after going to the gym  Ambulates with slight limp

## 2024-05-25 NOTE — Discharge Instructions (Addendum)
 Your x-ray is normal.  It is possible that you have injured your meniscus.  Please follow-up with orthopedics.  You may wear the brace on your knee for extra support while on your feet, though please remove at night.  Please avoid pivoting motions as this may worsen your symptoms.  Please also apply ice to your knee, 20 minutes on, 20 minutes off, multiple times per day.  You may take ibuprofen  600 mg every 6-8 hours as needed for pain.  Please return for any new, worsening, or changing symptoms or other concerns.

## 2024-05-25 NOTE — ED Provider Notes (Signed)
 [facility] Provider Note    Event Date/Time   First MD Initiated Contact with Patient 05/25/24 [phone]     (approximate)   History   Knee Pain   HPI  Brad Walton is a 43 y.o. male who presents today for evaluation of left knee pain.  Patient reports that he was doing squats at the gym and thinks that he pivoted and felt a sharp pain in the medial aspect of his left knee.  He reports that it feels like it is popping and clicking.  Denies radiation of pain.  He reports that his pain has been present for 4 days.  No weakness.  No other injury sustained.  He has not noticed any swelling or redness to his knee.  No constitutional symptoms.  There are no active problems to display for this patient.         Physical Exam   Triage Vital Signs: ED Triage Vitals  Encounter Vitals Group     BP 05/25/24 0639 128/88     Girls Systolic BP Percentile --      Girls Diastolic BP Percentile --      Boys Systolic BP Percentile --      Boys Diastolic BP Percentile --      Pulse Rate 05/25/24 0639 (!) 58     Resp 05/25/24 0639 18     Temp 05/25/24 0639 98.4 F (36.9 C)     Temp src --      SpO2 05/25/24 0639 100 %     Weight 05/25/24 0638 200 lb (90.7 kg)     Height 05/25/24 0638 5' 2 (1.575 m)     Head Circumference --      Peak Flow --      Pain Score 05/25/24 0638 9     Pain Loc --      Pain Education --      Exclude from Growth Chart --     Most recent vital signs: Vitals:   05/25/24 0639  BP: 128/88  Pulse: (!) 58  Resp: 18  Temp: 98.4 F (36.9 C)  SpO2: 100%    Physical Exam Vitals and nursing note reviewed.  Constitutional:      General: Awake and alert. No acute distress.    Appearance: Normal appearance. The patient is normal weight.  HENT:     Head: Normocephalic and atraumatic.     Mouth: Mucous membranes are moist.  Eyes:     General: PERRL. Normal EOMs        Right eye: No discharge.        Left eye: No discharge.      Conjunctiva/sclera: Conjunctivae normal.  Cardiovascular:     Rate and Rhythm: Normal rate and regular rhythm.     Pulses: Normal pulses.     Heart sounds: Normal heart sounds Pulmonary:     Effort: Pulmonary effort is normal. No respiratory distress.     Breath sounds: Normal breath sounds.  Abdominal:     Abdomen is soft. There is no abdominal tenderness. No rebound or guarding. No distention. Musculoskeletal:        General: No swelling. Normal range of motion.     Cervical back: Normal range of motion and neck supple.  Left knee: No deformity or rash. Mild medial joint line tenderness. No patellar tenderness, no ballotment Warm and well perfused extremity with 2+ pedal pulses 5/5 strength to dorsiflexion and plantarflexion at the ankle with intact sensation throughout  extremity Normal range of motion of the knee, with intact flexion and extension to active and passive range of motion. Extensor mechanism intact. No ligamentous laxity. Negative anterior/posterior drawer/negative lachman, Pain with mcmurrays No effusion or warmth Intact quadriceps, hamstring function, patellar tendon function Pelvis stable Full ROM of ankle without pain or swelling Foot warm and well perfused Skin:    General: Skin is warm and dry.     Capillary Refill: Capillary refill takes less than 2 seconds.     Findings: No rash.  Neurological:     Mental Status: The patient is awake and alert.      ED Results / Procedures / Treatments   Labs (all labs ordered are listed, but only abnormal results are displayed) Labs Reviewed - No data to display   EKG     RADIOLOGY I independently reviewed and interpreted imaging and agree with radiologists findings.     PROCEDURES:  Critical Care performed:   Procedures   MEDICATIONS ORDERED IN ED: Medications - No data to display   IMPRESSION / MDM / ASSESSMENT AND PLAN / ED COURSE  I reviewed the triage vital signs and the nursing  notes.   Differential diagnosis includes, but is not limited to, meniscus injury, ligamental strain, less likely osseous injury  Patient is awake and alert, hemodynamically stable and afebrile.  He is neurovascularly intact.  There is no obvious deformity noted to his knee, no effusion, warmth, or erythema to suggest septic joint.  He does have mild tenderness to the medial aspect of his knee, and pain with McMurray's maneuver consistent with possible meniscal injury.  X-ray obtained in triage is negative for any acute osseous injury or effusion.  Patient was given an Ace wrap for extra support.  He was instructed to follow-up with orthopedics.  He was instructed to remove the Ace wrap at night to decrease his risk for developing a blood clot by not wearing it 24 hours/day.  We discussed rest, ice, elevation.  We also discussed that he may take ibuprofen  600 mg every 6-8 hours as needed for pain.  I advised that he refrain from pivoting motions as this may worsen his symptoms.  We discussed return precautions in the meantime.  Patient understands and agrees with plan.  He was discharged in stable condition.   Patient's presentation is most consistent with acute complicated illness / injury requiring diagnostic workup.    FINAL CLINICAL IMPRESSION(S) / ED DIAGNOSES   Final diagnoses:  Acute pain of left knee     Rx / DC Orders   ED Discharge Orders     None        Note:  This document was prepared using Dragon voice recognition software and may include unintentional dictation errors.   Blayklee Mable E, PA-C 05/25/24 1026    Dicky Anes, MD 05/25/24 [phone]

## 2024-06-12 ENCOUNTER — Other Ambulatory Visit: Payer: Self-pay

## 2024-06-12 ENCOUNTER — Emergency Department

## 2024-06-12 ENCOUNTER — Emergency Department
Admission: EM | Admit: 2024-06-12 | Discharge: 2024-06-12 | Disposition: A | Discharge status: Home / Self Care | Attending: Emergency Medicine | Admitting: Emergency Medicine

## 2024-06-12 DIAGNOSIS — M25562 Pain in left knee: Secondary | ICD-10-CM | POA: Insufficient documentation

## 2024-06-12 DIAGNOSIS — S8992XA Unspecified injury of left lower leg, initial encounter: Secondary | ICD-10-CM | POA: Diagnosis present

## 2024-06-12 DIAGNOSIS — X501XXA Overexertion from prolonged static or awkward postures, initial encounter: Secondary | ICD-10-CM | POA: Diagnosis not present

## 2024-06-12 MED ORDER — IBUPROFEN 800 MG PO TABS
800.0000 mg | ORAL_TABLET | Freq: Once | ORAL | Status: AC
  Administered 2024-06-12: 800 mg via ORAL
  Filled 2024-06-12: qty 1

## 2024-06-12 MED ORDER — ACETAMINOPHEN 325 MG PO TABS
650.0000 mg | ORAL_TABLET | Freq: Once | ORAL | Status: AC
  Administered 2024-06-12: 650 mg via ORAL
  Filled 2024-06-12: qty 2

## 2024-06-12 NOTE — ED Provider Notes (Signed)
 [facility] Emergency Department Provider Note     Event Date/Time   First MD Initiated Contact with Patient 06/12/24 1352     (approximate)   History   Leg Injury   HPI  Brad Walton is a 43 y.o. male presents to the ED for evaluation of left knee pain x 3 days ago, progressively worsening.  Patient reports he stepped into a pothole 2 weeks ago.  His pain resolved but then stepped into the same pothole again 3 days ago.  He reports pain is worse and unable to bear weight.  No deformity noted.    Physical Exam   Triage Vital Signs: ED Triage Vitals  Encounter Vitals Group     BP 06/12/24 1325 123/71     Girls Systolic BP Percentile --      Girls Diastolic BP Percentile --      Boys Systolic BP Percentile --      Boys Diastolic BP Percentile --      Pulse Rate 06/12/24 1325 (!) 56     Resp 06/12/24 1325 17     Temp 06/12/24 1325 98 F (36.7 C)     Temp src --      SpO2 06/12/24 1325 98 %     Weight 06/12/24 1323 200 lb (90.7 kg)     Height 06/12/24 1323 5' 7 (1.702 m)     Head Circumference --      Peak Flow --      Pain Score 06/12/24 1323 8     Pain Loc --      Pain Education --      Exclude from Growth Chart --     Most recent vital signs: Vitals:   06/12/24 1325  BP: 123/71  Pulse: (!) 56  Resp: 17  Temp: 98 F (36.7 C)  SpO2: 98%    General Awake, no distress.  HEENT NCAT.  CV:  Good peripheral perfusion.  RESP:  Normal effort.  ABD:  No distention.  Other:  Left knee reveals no visible deformity.  There is tenderness to the medial joint line.  Full active range of motion.  Negative valgus and varus stress test.  Neurovascular status intact all throughout.   ED Results / Procedures / Treatments   Labs (all labs ordered are listed, but only abnormal results are displayed) Labs Reviewed - No data to display  RADIOLOGY  I personally viewed and evaluated these images as part of my medical decision making, as well  as reviewing the written report by the radiologist.  ED Provider Interpretation: Normal appearing knee x-ray  DG Knee Complete 4 Views Left Result Date: 06/12/2024 CLINICAL DATA:  Left leg pain below the knee EXAM: LEFT KNEE - COMPLETE 4+ VIEW COMPARISON:  05/25/2024 FINDINGS: No evidence of fracture, dislocation, or joint effusion. No evidence of arthropathy or other focal bone abnormality. Soft tissues are unremarkable. IMPRESSION: No significant abnormality observed. If pain persists despite conservative therapy, MRI may be warranted for further characterization. Electronically Signed   By: Ryan Salvage M.D.   On: 06/12/2024 14:50    PROCEDURES:  Critical Care performed: No  Procedures   MEDICATIONS ORDERED IN ED: Medications  ibuprofen  (ADVIL ) tablet 800 mg (800 mg Oral Given 06/12/24 1450)  acetaminophen (TYLENOL) tablet 650 mg (650 mg Oral Given 06/12/24 1450)     IMPRESSION / MDM / ASSESSMENT AND PLAN / ED COURSE  I reviewed the triage vital signs and the nursing notes.  43 y.o. male presents to the emergency department for evaluation and treatment of left knee injury. See HPI for further details.   Differential diagnosis includes, but is not limited to fracture, dislocation, contusion, ligamentous injury considered  Patient's presentation is most consistent with acute complicated illness / injury requiring diagnostic workup.  Patient is alert and oriented.  He is hemodynamically stable.  Physical exam findings are stated above.  X-rays are reassuring.  Patient placed in knee sleeve and provided with crutches for nonweightbearing status for 1 week.  Education on RICE therapy provided and patient verbalized understanding.  Patient is advised to follow-up with orthopedic if symptoms do not improve in another week's time.  Work note provided.  Patient stable condition for discharge home.  ED return precaution discussed.  FINAL CLINICAL  IMPRESSION(S) / ED DIAGNOSES   Final diagnoses:  Left knee injury, initial encounter   Rx / DC Orders   ED Discharge Orders     None      Note:  This document was prepared using Dragon voice recognition software and may include unintentional dictation errors.    Margrette, Aedyn Kempfer A, PA-C 06/12/24 1732    Suzanne Kirsch, MD 06/13/24 1601

## 2024-06-12 NOTE — Discharge Instructions (Addendum)
 Non-weightbearing status with assistance of crutches.  If symptoms do not improve in 1 week please follow-up with orthopedic further evaluation.  Limit your physical activity.  Alternate Tylenol and ibuprofen  for pain as needed.

## 2024-06-12 NOTE — ED Triage Notes (Signed)
 Pt comes with left leg pain below his knee. Pt states he has injured it awhile back and thinks he has now reinjured it.
